# Patient Record
Sex: Male | Born: 1955 | Race: Black or African American | Hispanic: No | Marital: Married | Smoking: Never smoker
Health system: Southern US, Community
[De-identification: ages and names within clinical notes are randomized; demographics above are authoritative.]

## PROBLEM LIST (undated history)

## (undated) DIAGNOSIS — C300 Malignant neoplasm of nasal cavity: Secondary | ICD-10-CM

## (undated) DIAGNOSIS — J9819 Other pulmonary collapse: Secondary | ICD-10-CM

---

## 2004-08-11 ENCOUNTER — Ambulatory Visit: Payer: Self-pay | Admitting: Gastroenterology

## 2007-12-02 ENCOUNTER — Ambulatory Visit: Payer: Self-pay | Admitting: Gastroenterology

## 2009-12-13 ENCOUNTER — Ambulatory Visit: Payer: Self-pay | Admitting: Unknown Physician Specialty

## 2010-09-05 ENCOUNTER — Emergency Department: Payer: Self-pay | Admitting: Emergency Medicine

## 2012-10-08 DIAGNOSIS — J9819 Other pulmonary collapse: Secondary | ICD-10-CM

## 2012-10-08 HISTORY — DX: Other pulmonary collapse: J98.19

## 2014-10-14 ENCOUNTER — Inpatient Hospital Stay: Payer: Self-pay | Admitting: Surgery

## 2014-10-14 ENCOUNTER — Ambulatory Visit: Payer: Self-pay | Admitting: Family Medicine

## 2014-10-14 LAB — CBC WITH DIFFERENTIAL/PLATELET
BASOS ABS: 0 10*3/uL (ref 0.0–0.1)
Basophil %: 0.4 %
Eosinophil #: 0.3 10*3/uL (ref 0.0–0.7)
Eosinophil %: 3.2 %
HCT: 55.4 % — AB (ref 40.0–52.0)
HGB: 17.6 g/dL (ref 13.0–18.0)
LYMPHS PCT: 20.1 %
Lymphocyte #: 1.8 10*3/uL (ref 1.0–3.6)
MCH: 30.1 pg (ref 26.0–34.0)
MCHC: 31.8 g/dL — ABNORMAL LOW (ref 32.0–36.0)
MCV: 95 fL (ref 80–100)
MONOS PCT: 9.6 %
Monocyte #: 0.9 x10 3/mm (ref 0.2–1.0)
Neutrophil #: 5.9 10*3/uL (ref 1.4–6.5)
Neutrophil %: 66.7 %
PLATELETS: 178 10*3/uL (ref 150–440)
RBC: 5.85 10*6/uL (ref 4.40–5.90)
RDW: 14.7 % — AB (ref 11.5–14.5)
WBC: 8.9 10*3/uL (ref 3.8–10.6)

## 2014-10-14 LAB — BASIC METABOLIC PANEL
ANION GAP: 6 — AB (ref 7–16)
BUN: 14 mg/dL (ref 7–18)
CO2: 28 mmol/L (ref 21–32)
CREATININE: 1.54 mg/dL — AB (ref 0.60–1.30)
Calcium, Total: 8.9 mg/dL (ref 8.5–10.1)
Chloride: 105 mmol/L (ref 98–107)
GFR CALC NON AF AMER: 50 — AB
Glucose: 81 mg/dL (ref 65–99)
Osmolality: 277 (ref 275–301)
Potassium: 3.8 mmol/L (ref 3.5–5.1)
SODIUM: 139 mmol/L (ref 136–145)

## 2014-10-14 LAB — PROTIME-INR
INR: 1
Prothrombin Time: 13.2 secs (ref 11.5–14.7)

## 2014-10-14 LAB — APTT: ACTIVATED PTT: 30.6 s (ref 23.6–35.9)

## 2014-10-15 LAB — BASIC METABOLIC PANEL
ANION GAP: 6 — AB (ref 7–16)
BUN: 13 mg/dL (ref 7–18)
Calcium, Total: 8.5 mg/dL (ref 8.5–10.1)
Chloride: 107 mmol/L (ref 98–107)
Co2: 28 mmol/L (ref 21–32)
Creatinine: 1.38 mg/dL — ABNORMAL HIGH (ref 0.60–1.30)
EGFR (Non-African Amer.): 57 — ABNORMAL LOW
Glucose: 87 mg/dL (ref 65–99)
Osmolality: 281 (ref 275–301)
Potassium: 4 mmol/L (ref 3.5–5.1)
Sodium: 141 mmol/L (ref 136–145)

## 2014-10-15 LAB — CBC WITH DIFFERENTIAL/PLATELET
BASOS ABS: 0 10*3/uL (ref 0.0–0.1)
BASOS PCT: 0.3 %
Eosinophil #: 0.2 10*3/uL (ref 0.0–0.7)
Eosinophil %: 2.2 %
HCT: 52.2 % — AB (ref 40.0–52.0)
HGB: 16.9 g/dL (ref 13.0–18.0)
LYMPHS PCT: 12.5 %
Lymphocyte #: 1.2 10*3/uL (ref 1.0–3.6)
MCH: 30.4 pg (ref 26.0–34.0)
MCHC: 32.3 g/dL (ref 32.0–36.0)
MCV: 94 fL (ref 80–100)
MONOS PCT: 10.6 %
Monocyte #: 1 x10 3/mm (ref 0.2–1.0)
NEUTROS ABS: 7.3 10*3/uL — AB (ref 1.4–6.5)
Neutrophil %: 74.4 %
Platelet: 171 10*3/uL (ref 150–440)
RBC: 5.56 10*6/uL (ref 4.40–5.90)
RDW: 15.4 % — ABNORMAL HIGH (ref 11.5–14.5)
WBC: 9.8 10*3/uL (ref 3.8–10.6)

## 2014-10-19 LAB — CULTURE, BLOOD (SINGLE)

## 2014-10-21 ENCOUNTER — Ambulatory Visit: Payer: Self-pay | Admitting: Cardiothoracic Surgery

## 2014-10-21 ENCOUNTER — Ambulatory Visit: Payer: Self-pay | Admitting: Family Medicine

## 2014-11-08 ENCOUNTER — Ambulatory Visit: Payer: Self-pay | Admitting: Cardiothoracic Surgery

## 2014-11-15 ENCOUNTER — Encounter: Payer: Self-pay | Admitting: Urology

## 2014-12-07 ENCOUNTER — Encounter
Admit: 2014-12-07 | Disposition: A | Discharge status: Home / Self Care | Payer: Self-pay | Attending: Urology | Admitting: Urology

## 2015-02-06 NOTE — H&P (Signed)
   Subjective/Chief Complaint right sided chest pain.   History of Present Illness 59 y/o male preents to PCP with 2 days of right sided chest pain, chest xray earlier today shows large right PTX. no antecedent trauma, non-smoker.  Last ate around 12 today.   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 right forearm surgery   Primary Physician Dr Jacqualine Code.   Code Status Full Code   ALLERGIES:  No Known Allergies:   HOME MEDICATIONS: Medication Instructions Status  acetaminophen-HYDROcodone 300 mg-7.5 mg tablet 1 tab(s) orally every 6 hours as needed   Active  cyclobenzaprine 10 mg tablet 1 tab(s) orally 3 times a day as needed   Active  ibuprofen 800 mg tablet 1 tab(s) orally 3 times a day x 7 days  Active  Flonase 0.05 mg/inh nasal spray 2 spray(s) nasal 2 times a day to each nostril Active  vit D 3 2000 iu 2 tab(s)  once a day  Active   Family and Social History:  Family History Non-Contributory   Social History negative tobacco, negative ETOH, negative Illicit drugs   Place of Living Home   Review of Systems:  Subjective/Chief Complaint see above   Chest Pain Yes   Medications/Allergies Reviewed Medications/Allergies reviewed   Physical Exam:  GEN no acute distress, tall muscular male in NAD 98.6 p 76 bp 137/76   HEENT pale conjunctivae, PERRL   NECK supple   RESP normal resp effort  no use of accessory muscles  diminished breath sounds on right chest, no crepitus.   CARD regular rate   ABD denies tenderness  denies Flank Tenderness  no hernia  soft   LYMPH negative neck   EXTR negative cyanosis/clubbing, negative edema   SKIN normal to palpation   NEURO cranial nerves intact   PSYCH A+O to time, place, person, good insight    Assessment/Admission Diagnosis 59 y/o male non-smoker with spontaneous right sided pneumothorax.   Plan admit, right chest tube with anesthesia assistance.  will ask Dr Genevive Bi to evaluate Friday.   Discussed procedure with him and wife and all questions addressed.  Films personally reviewed.   Electronic Signatures: Sherri Rad (MD)  (Signed 07-Jan-16 18:30)  Authored: CHIEF COMPLAINT and HISTORY, ALLERGIES, HOME MEDICATIONS, FAMILY AND SOCIAL HISTORY, REVIEW OF SYSTEMS, PHYSICAL EXAM, ASSESSMENT AND PLAN   Last Updated: 07-Jan-16 18:30 by Sherri Rad (MD)

## 2015-02-06 NOTE — Discharge Summary (Signed)
PATIENT NAME:  Ronnie Daniels, ABDULLAH MR#:  [id number] DATE OF BIRTH:  January 29, 1956  DATE OF ADMISSION:  10/14/2014 DATE OF DISCHARGE:  10/17/2014  DIAGNOSIS: Spontaneous right pneumothorax.  PROCEDURE: Right chest tube placement.  HISTORY OF PRESENT ILLNESS AND HOSPITAL COURSE: This is a patient who presented to the Emergency Room and was admitted by Dr. Marina Gravel for spontaneous right pneumothorax. Dr. Marina Gravel had obtained consent for taking him to the operating room for chest tube placement. I performed that chest tube placement and he made a prompt recovery. Air leak sealed and his chest tube was removed by Dr. Marina Gravel, and he was discharged in stable condition on oral analgesics to follow up in our office in 10 days.   ____________________________ Jerrol Banana Burt Knack, MD rec:ST D: 10/22/2014 21:44:26 ET T: 10/23/2014 01:31:45 ET JOB#: [id number]  cc: Jerrol Banana. Burt Knack, MD, <Dictator> Florene Glen MD ELECTRONICALLY SIGNED 10/23/2014 19:31

## 2015-02-06 NOTE — Op Note (Signed)
PATIENT NAME:  Ronnie Daniels, Ronnie Daniels MR#:  [id number] DATE OF BIRTH:  1956/02/10  DATE OF PROCEDURE:  10/14/2014  PREOPERATIVE DIAGNOSIS: Spontaneous right pneumothorax.   POSTOPERATIVE DIAGNOSIS: Spontaneous right pneumothorax.   PROCEDURE: Right closed tube thoracostomy placement.   SURGEON:  Florene Glen, MD   ANESTHESIA: IV sedation and local anesthetic.   INDICATIONS: This is a patient with a spontaneous pneumothorax on the right. Dr. Marina Gravel admitted the patient and consented the patient. I reviewed with he and his family the rationale for placing a chest tube, the options of observation, the risk of bleeding, infection, the need for a thoracoscopy in the future if this were to happen again. He understood and agreed to proceed. This was all reviewed for him in the preop holding area personally, after reviewing his films and ensuring that he was marked properly.   He was taken to the operating room where he was placed into a mild right side up position with a pillow and well padded. He was given IV sedation, prepped and draped in a sterile fashion; 0.5% lidocaine plain was infiltrated in the skin and subcutaneous tissues and the mid axillary line on the right side, and then in this obese patient approximating the fifth rib, the needle was advanced over the top of the fifth rib and a pneumothorax was identified by aspiration.   An incision was made and dissection down to rib and over the fifth rib was performed. The clamp was placed in the pleural space opened and a gash of air exuded. Then a 24 French chest tube was placed into the same space, and held in with multiple 0 silk sutures, a U-suture of 0 silk was placed as well.   Then the chest tube was placed to Pleur-evac suction and a large air leak was noted.   Sterile dressing was placed. He was taken to the recovery room, where chest x-ray was ordered. He was left in stable condition in the PACU.     ____________________________ Jerrol Banana. Burt Knack, MD rec:nt D: 10/14/2014 20:06:48 ET T: 10/14/2014 22:22:09 ET JOB#: [id number]  cc: Jerrol Banana. Burt Knack, MD, <Dictator> Florene Glen MD ELECTRONICALLY SIGNED 10/15/2014 1:22

## 2016-05-08 ENCOUNTER — Ambulatory Visit
Admission: RE | Admit: 2016-05-08 | Discharge: 2016-05-08 | Disposition: A | Discharge status: Home / Self Care | Payer: BLUE CROSS/BLUE SHIELD | Source: Ambulatory Visit | Attending: Orthopedic Surgery | Admitting: Orthopedic Surgery

## 2016-05-15 ENCOUNTER — Ambulatory Visit
Admission: RE | Admit: 2016-05-15 | Discharge: 2016-05-15 | Disposition: A | Discharge status: Home / Self Care | Payer: BLUE CROSS/BLUE SHIELD | Source: Ambulatory Visit | Attending: Orthopedic Surgery | Admitting: Orthopedic Surgery

## 2016-05-15 ENCOUNTER — Ambulatory Visit
Admission: RE | Admit: 2016-05-15 | Discharge: 2016-05-15 | Disposition: A | Discharge status: Home / Self Care | Payer: BLUE CROSS/BLUE SHIELD | Source: Ambulatory Visit | Attending: Urology | Admitting: Urology

## 2016-05-15 DIAGNOSIS — Z0189 Encounter for other specified special examinations: Secondary | ICD-10-CM | POA: Diagnosis present

## 2016-05-15 LAB — CBC
HCT: 50.2 % (ref 40.0–52.0)
Hemoglobin: 16.8 g/dL (ref 13.0–18.0)
MCH: 31 pg (ref 26.0–34.0)
MCHC: 33.4 g/dL (ref 32.0–36.0)
MCV: 92.7 fL (ref 80.0–100.0)
PLATELETS: 174 10*3/uL (ref 150–440)
RBC: 5.41 MIL/uL (ref 4.40–5.90)
RDW: 14.2 % (ref 11.5–14.5)
WBC: 7 10*3/uL (ref 3.8–10.6)

## 2016-08-10 ENCOUNTER — Ambulatory Visit (INDEPENDENT_AMBULATORY_CARE_PROVIDER_SITE_OTHER): Payer: BLUE CROSS/BLUE SHIELD

## 2016-08-10 ENCOUNTER — Ambulatory Visit
Admission: EM | Admit: 2016-08-10 | Discharge: 2016-08-10 | Disposition: A | Discharge status: Home / Self Care | Payer: BLUE CROSS/BLUE SHIELD | Attending: Family Medicine | Admitting: Family Medicine

## 2016-08-10 DIAGNOSIS — J40 Bronchitis, not specified as acute or chronic: Secondary | ICD-10-CM

## 2016-08-10 HISTORY — DX: Malignant neoplasm of nasal cavity: C30.0

## 2016-08-10 HISTORY — DX: Other pulmonary collapse: J98.19

## 2016-08-10 MED ORDER — HYDROCOD POLST-CPM POLST ER 10-8 MG/5ML PO SUER: 5.0000 mL | Freq: Every evening | ORAL | 0 refills | Status: AC | PRN

## 2016-08-10 MED ORDER — AZITHROMYCIN 250 MG PO TABS: ORAL_TABLET | ORAL | 0 refills | Status: DC

## 2016-08-10 MED ORDER — BENZONATATE 100 MG PO CAPS: 100.0000 mg | ORAL_CAPSULE | Freq: Three times a day (TID) | ORAL | 0 refills | Status: DC | PRN

## 2016-08-10 NOTE — ED Provider Notes (Signed)
[facility] ____________________________________________  Time seen: Approximately 8:55 AM  I have reviewed the triage vital signs and the nursing notes.   HISTORY  Chief Complaint Cough  HPI Ronnie Daniels is a 60 y.o. male presents for the complaints of 1 week of cough and chest congestion. Patient reports he had some nasal congestion which has improved. Patient states cough increases at night but continues throughout the day. States occasionally productive of whitish whitish mucus. Denies and fevers. Reports continues to eat and drink well. Patient reports his grandson recently had similar symptoms.  Denies chest pain, shortness of breath, chest pain with deep breath, abdominal pain, dysuria, neck pain, back pain, recent sickness or recent antibiotic use. 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  PCP: [facility]   Past Medical History:  Diagnosis Date  . 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   History reviewed. No pertinent surgical history.  Current Outpatient Rx  . Order #: [id number] Class: Historical Med  . Order #: [id number] Class: Historical Med  . Order #: [id number] Class: Normal  . Order #: [id number] Class: Normal  . Order #: [id number] Class: Print    No current facility-administered medications for this encounter.   Current Outpatient Prescriptions:  .  tadalafil (CIALIS) 10 MG tablet, Take 10 mg by mouth daily as needed for erectile dysfunction., Disp: , Rfl:  .  Testosterone 20 % CREA, by Does not apply route., Disp: , Rfl:  .  azithromycin (ZITHROMAX Z-PAK) 250 MG tablet, Take 2 tablets (500 mg) on  Day 1,  followed by 1 tablet (250 mg) once daily on Days 2 through 5., Disp: 6 each, Rfl: 0 .  benzonatate (TESSALON PERLES) 100 MG capsule, Take 1 capsule (100 mg total) by mouth 3 (three) times  daily as needed for cough., Disp: 15 capsule, Rfl: 0 .  chlorpheniramine-HYDROcodone (TUSSIONEX PENNKINETIC ER) 10-8 MG/5ML SUER, Take 5 mLs by mouth at bedtime as needed for cough. do not drive or operate machinery while taking as can cause drowsiness., Disp: 100 mL, Rfl: 0  Allergies Review of patient's allergies indicates no known allergies.  Family History  Problem Relation Age of Onset  . Kidney disease Mother   . Hypertension Mother   . Alzheimer's disease Father     Social History Social History  Substance Use Topics  . Smoking status: Never Smoker  . Smokeless tobacco: Never Used  . Alcohol use No    Review of Systems Constitutional: No fever/chills Eyes: No visual changes. ENT: No sore throat. Cardiovascular: Denies chest pain. Respiratory: Denies shortness of breath. As above.  Gastrointestinal: No abdominal pain.  No nausea, no vomiting.  No diarrhea.  No constipation. Genitourinary: Negative for dysuria. Musculoskeletal: Negative for back pain. Skin: Negative for rash. Neurological: Negative for headaches, focal weakness or numbness.  10-point ROS otherwise negative.  ____________________________________________   PHYSICAL EXAM:  VITAL SIGNS: ED Triage Vitals  Enc Vitals Group     BP 08/10/16 0850 127/70     Pulse Rate 08/10/16 0850 71     Resp 08/10/16 0850 16     Temp 08/10/16 0850 97.6 F (36.4 C)     Temp Source 08/10/16 0850 Tympanic     SpO2 08/10/16 0850 98 %     Weight 08/10/16 0846 258 lb (117 kg)     Height  08/10/16 0846 6\' 5"  (1.956 m)     Head Circumference --      Peak Flow --      Pain Score 08/10/16 0850 0     Pain Loc --      Pain Edu? --      Excl. in [place]? --     Constitutional: Alert and oriented. Well appearing and in no acute distress. Eyes: Conjunctivae are normal. PERRL. EOMI. ENT      Head: Normocephalic and atraumatic.      Ears: no erythema, normal TMs bilaterally.       Nose: No congestion/rhinnorhea.       Mouth/Throat: Mucous membranes are moist.Oropharynx non-erythematous.No pharyngeal erythema.  Neck: No stridor. Supple without meningismus.  Hematological/Lymphatic/Immunilogical: No cervical lymphadenopathy. Cardiovascular: Normal rate, regular rhythm. Grossly normal heart sounds.  Good peripheral circulation. Respiratory: Normal respiratory effort without tachypnea nor retractions. No wheezes. Good air movement. Mild scattered rhonchi.  Gastrointestinal: Soft and nontender. No distention. No CVA tenderness. Musculoskeletal:  Nontender with normal range of motion in all extremities. No midline cervical, thoracic or lumbar tenderness to palpation.  Neurologic:  Normal speech and language. No gross focal neurologic deficits are appreciated. Speech is normal. No gait instability.  Skin:  Skin is warm, dry and intact. No rash noted. Psychiatric: Mood and affect are normal. Speech and behavior are normal. Patient exhibits appropriate insight and judgment   ___________________________________________   LABS (all labs ordered are listed, but only abnormal results are displayed)  Labs Reviewed - No data to display ____________________________________________   RADIOLOGY  Dg Chest 2 View  Result Date: 08/10/2016 CLINICAL DATA:  Cough. EXAM: CHEST  2 VIEW COMPARISON:  10/21/2014 . FINDINGS: Mediastinum hilar structures normal. Cardiomegaly with normal pulmonary vascularity. Interval resolution of basilar atelectasis. Small bilateral pleural effusions versus pleural scarring again noted . Mild bilateral pleural thickening noted IMPRESSION: 1. No acute pulmonary disease. Interval resolution of previously identified bibasilar atelectasis noted on chest x-ray of 10/21/2014. 2. Mild basilar pleural thickening noted consistent with scarring. Tiny pleural effusions cannot be excluded . Electronically Signed   By: Marcello Moores  Register   On: 08/10/2016 09:29    ____________________________________________   PROCEDURES Procedures   INITIAL IMPRESSION / ASSESSMENT AND PLAN / ED COURSE  Pertinent labs & imaging results that were available during my care of the patient were reviewed by me and considered in my medical decision making (see chart for details).  Well-appearing patient. No acute distress. Presenting for the complaints of cough for the last week. Mild scattered rhonchi. Patient does have a history of a spontaneous lung collapse, will evaluate chest x-ray.    Chest x-ray per radiologist, no acute pulmonary disease, interval resolution of previously identified atelectasis, mild basilar pleural thickening noted. Will treat patient with azithromycin, when necessary Tessalon Perles and when necessary Tussionex at night. Encouraged supportive care, rest, fluids and PCP follow-up. Discussed indication, risks and benefits of medications with patient.  Discussed follow up with Primary care physician this week. Discussed follow up and return parameters including no resolution or any worsening concerns. Patient verbalized understanding and agreed to plan.   ____________________________________________   FINAL CLINICAL IMPRESSION(S) / ED DIAGNOSES  Final diagnoses:  Bronchitis     New Prescriptions   AZITHROMYCIN (ZITHROMAX Z-PAK) 250 MG TABLET    Take 2 tablets (500 mg) on  Day 1,  followed by 1 tablet (250 mg) once daily on Days 2 through 5.   BENZONATATE (TESSALON PERLES) 100 MG CAPSULE  Take 1 capsule (100 mg total) by mouth 3 (three) times daily as needed for cough.   CHLORPHENIRAMINE-HYDROCODONE (TUSSIONEX PENNKINETIC ER) 10-8 MG/5ML SUER    Take 5 mLs by mouth at bedtime as needed for cough. do not drive or operate machinery while taking as can cause drowsiness.    Note: This dictation was prepared with Dragon dictation along with smaller phrase technology. Any transcriptional errors that result from this process are unintentional.     Clinical Course      Marylene Land, NP 08/10/16 [place], NP 08/10/16 [phone]

## 2016-08-10 NOTE — ED Triage Notes (Signed)
Patient complains of cough with mucus productivity. Patient states that he has been having a scratchy throat and hoarseness x 1 week.

## 2016-08-10 NOTE — Discharge Instructions (Signed)
Take medication as prescribed. Rest. Drink plenty of fluids.  ° °Follow up with your primary care physician this week as needed. Return to Urgent care for new or worsening concerns.  ° °

## 2016-10-15 ENCOUNTER — Ambulatory Visit
Admission: EM | Admit: 2016-10-15 | Discharge: 2016-10-15 | Disposition: A | Discharge status: Home / Self Care | Payer: BLUE CROSS/BLUE SHIELD | Attending: Family Medicine | Admitting: Family Medicine

## 2016-10-15 DIAGNOSIS — J069 Acute upper respiratory infection, unspecified: Secondary | ICD-10-CM

## 2016-10-15 DIAGNOSIS — J209 Acute bronchitis, unspecified: Secondary | ICD-10-CM | POA: Diagnosis not present

## 2016-10-15 MED ORDER — HYDROCOD POLST-CPM POLST ER 10-8 MG/5ML PO SUER: 5.0000 mL | Freq: Two times a day (BID) | ORAL | 0 refills | Status: AC | PRN

## 2016-10-15 MED ORDER — ALBUTEROL SULFATE HFA 108 (90 BASE) MCG/ACT IN AERS: 2.0000 | INHALATION_SPRAY | RESPIRATORY_TRACT | 0 refills | Status: AC | PRN

## 2016-10-15 MED ORDER — AZITHROMYCIN 250 MG PO TABS: ORAL_TABLET | ORAL | 0 refills | Status: AC

## 2016-10-15 NOTE — ED Triage Notes (Signed)
Pt c/o cough that has lasted over a week and he is coughing up yellow phlegm.

## 2016-10-15 NOTE — ED Provider Notes (Signed)
[facility]    CSN: [id number] Arrival date & time: 10/15/16  0816     History   Chief Complaint Chief Complaint  Patient presents with  . Cough    HPI Ronnie Daniels is a 61 y.o. male.   Patient's here because of cough and congestion. States it started last week Monday but over the last 3 days is gotten worse is now become productive and some solids chest. He also reports some wheezing but not on a regular basis. He is not allergic to any medications he does not smoke. He states he has something like this about 2 months ago. Z-Pak and Tussionex and clear things up. He denies any chronic medical problems and no significant family medical history pertinent to today's visit.   The history is provided by the patient. No language interpreter was used.  Cough  Cough characteristics:  Non-productive Sputum characteristics:  Yellow Severity:  Mild Onset quality:  Gradual Timing:  Constant Progression:  Worsening Chronicity:  New Context: sick contacts and upper respiratory infection   Relieved by:  Cough suppressants Worsened by:  Nothing Ineffective treatments:  Decongestant Associated symptoms: wheezing   Associated symptoms: no rash and no rhinorrhea   Risk factors: recent infection     Past Medical History:  Diagnosis Date  . 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   History reviewed. No pertinent surgical history.     Home Medications    Prior to Admission medications   Medication Sig Start Date End Date Taking? Authorizing Provider  tadalafil (CIALIS) 10 MG tablet Take 10 mg by mouth daily as needed for erectile dysfunction.   Yes Historical Provider, MD  Testosterone 20 % CREA by Does not apply route.   Yes Historical Provider, MD  albuterol (PROVENTIL HFA;VENTOLIN HFA) 108 (90 Base) MCG/ACT inhaler Inhale 2 puffs into the lungs every 4 (four) hours as needed  for wheezing or shortness of breath. 10/15/16   Frederich Cha, MD  azithromycin (ZITHROMAX Z-PAK) 250 MG tablet Take 2 tablets first day and then 1 po a day for 4 days 10/15/16   Frederich Cha, MD  chlorpheniramine-HYDROcodone [facility] ER) 10-8 MG/5ML SUER Take 5 mLs by mouth at bedtime as needed for cough. do not drive or operate machinery while taking as can cause drowsiness. 08/10/16   Marylene Land, NP  chlorpheniramine-HYDROcodone [facility] PENNKINETIC ER) 10-8 MG/5ML SUER Take 5 mLs by mouth every 12 (twelve) hours as needed for cough. 10/15/16   Frederich Cha, MD    Family History Family History  Problem Relation Age of Onset  . Kidney disease Mother   . Hypertension Mother   . Alzheimer's disease Father     Social History Social History  Substance Use Topics  . Smoking status: Never Smoker  . Smokeless tobacco: Never Used  . Alcohol use No     Allergies   Patient has no known allergies.   Review of Systems Review of Systems  HENT: Positive for congestion. Negative for rhinorrhea.   Respiratory: Positive for cough and wheezing.   Skin: Negative for rash.  All other systems reviewed and are negative.    Physical Exam Triage Vital Signs ED Triage Vitals  Enc Vitals Group     BP 10/15/16 0827 131/80     Pulse Rate 10/15/16 0827 81     Resp 10/15/16 0827 18  Temp 10/15/16 0827 99.1 F (37.3 C)     Temp Source 10/15/16 0827 Oral     SpO2 10/15/16 0827 96 %     Weight 10/15/16 0827 258 lb (117 kg)     Height 10/15/16 0827 6\' 4"  (1.93 m)     Head Circumference --      Peak Flow --      Pain Score 10/15/16 0828 0     Pain Loc --      Pain Edu? --      Excl. in [place]? --    No data found.   Updated Vital Signs BP 131/80 (BP Location: Left Arm)   Pulse 81   Temp 99.1 F (37.3 C) (Oral)   Resp 18   Ht 6\' 4"  (1.93 m)   Wt 258 lb (117 kg)   SpO2 96%   BMI 31.40 kg/m   Visual Acuity Right Eye Distance:   Left Eye Distance:   Bilateral Distance:      Right Eye Near:   Left Eye Near:    Bilateral Near:     Physical Exam  Constitutional: He is oriented to person, place, and time. He appears well-developed and well-nourished. No distress.  HENT:  Head: Normocephalic and atraumatic.  Right Ear: External ear normal.  Left Ear: External ear normal.  Eyes: Pupils are equal, round, and reactive to light.  Neck: Normal range of motion. Neck supple.  Cardiovascular: Normal rate, regular rhythm and normal heart sounds.   Pulmonary/Chest: Breath sounds normal. He is in respiratory distress.  Musculoskeletal: Normal range of motion. He exhibits no tenderness or deformity.  Neurological: He is alert and oriented to person, place, and time.  Skin: Skin is warm. He is not diaphoretic.  Psychiatric: He has a normal mood and affect.  Vitals reviewed.    UC Treatments / Results  Labs (all labs ordered are listed, but only abnormal results are displayed) Labs Reviewed - No data to display  EKG  EKG Interpretation None       Radiology No results found.  Procedures Procedures (including critical care time)  Medications Ordered in UC Medications - No data to display   Initial Impression / Assessment and Plan / UC Course  I have reviewed the triage vital signs and the nursing notes.  Pertinent labs & imaging results that were available during my care of the patient were reviewed by me and considered in my medical decision making (see chart for details).  Clinical Course    We'll place patient on Tussionex 1 teaspoon twice a day Zithromax Z-PAK. Also will going to place him on albuterol inhaler since he has has some wheezing those lungs clear today. Follow-up PCP in 1-2 weeks as needed.  Final Clinical Impressions(s) / UC Diagnoses   Final diagnoses:  Upper respiratory tract infection, unspecified type  Acute bronchitis, unspecified organism    New Prescriptions New Prescriptions   ALBUTEROL (PROVENTIL HFA;VENTOLIN HFA)  108 (90 BASE) MCG/ACT INHALER    Inhale 2 puffs into the lungs every 4 (four) hours as needed for wheezing or shortness of breath.   AZITHROMYCIN (ZITHROMAX Z-PAK) 250 MG TABLET    Take 2 tablets first day and then 1 po a day for 4 days   CHLORPHENIRAMINE-HYDROCODONE (TUSSIONEX PENNKINETIC ER) 10-8 MG/5ML SUER    Take 5 mLs by mouth every 12 (twelve) hours as needed for cough.     Frederich Cha, MD 10/15/16 [phone]

## 2018-08-17 IMAGING — CR DG CHEST 2V
2 series · 2 of 2 positions shown · non-contrast
Comparison: 10/21/2014 .

CLINICAL DATA: Cough.

EXAM:
CHEST  2 VIEW

[chest pa]
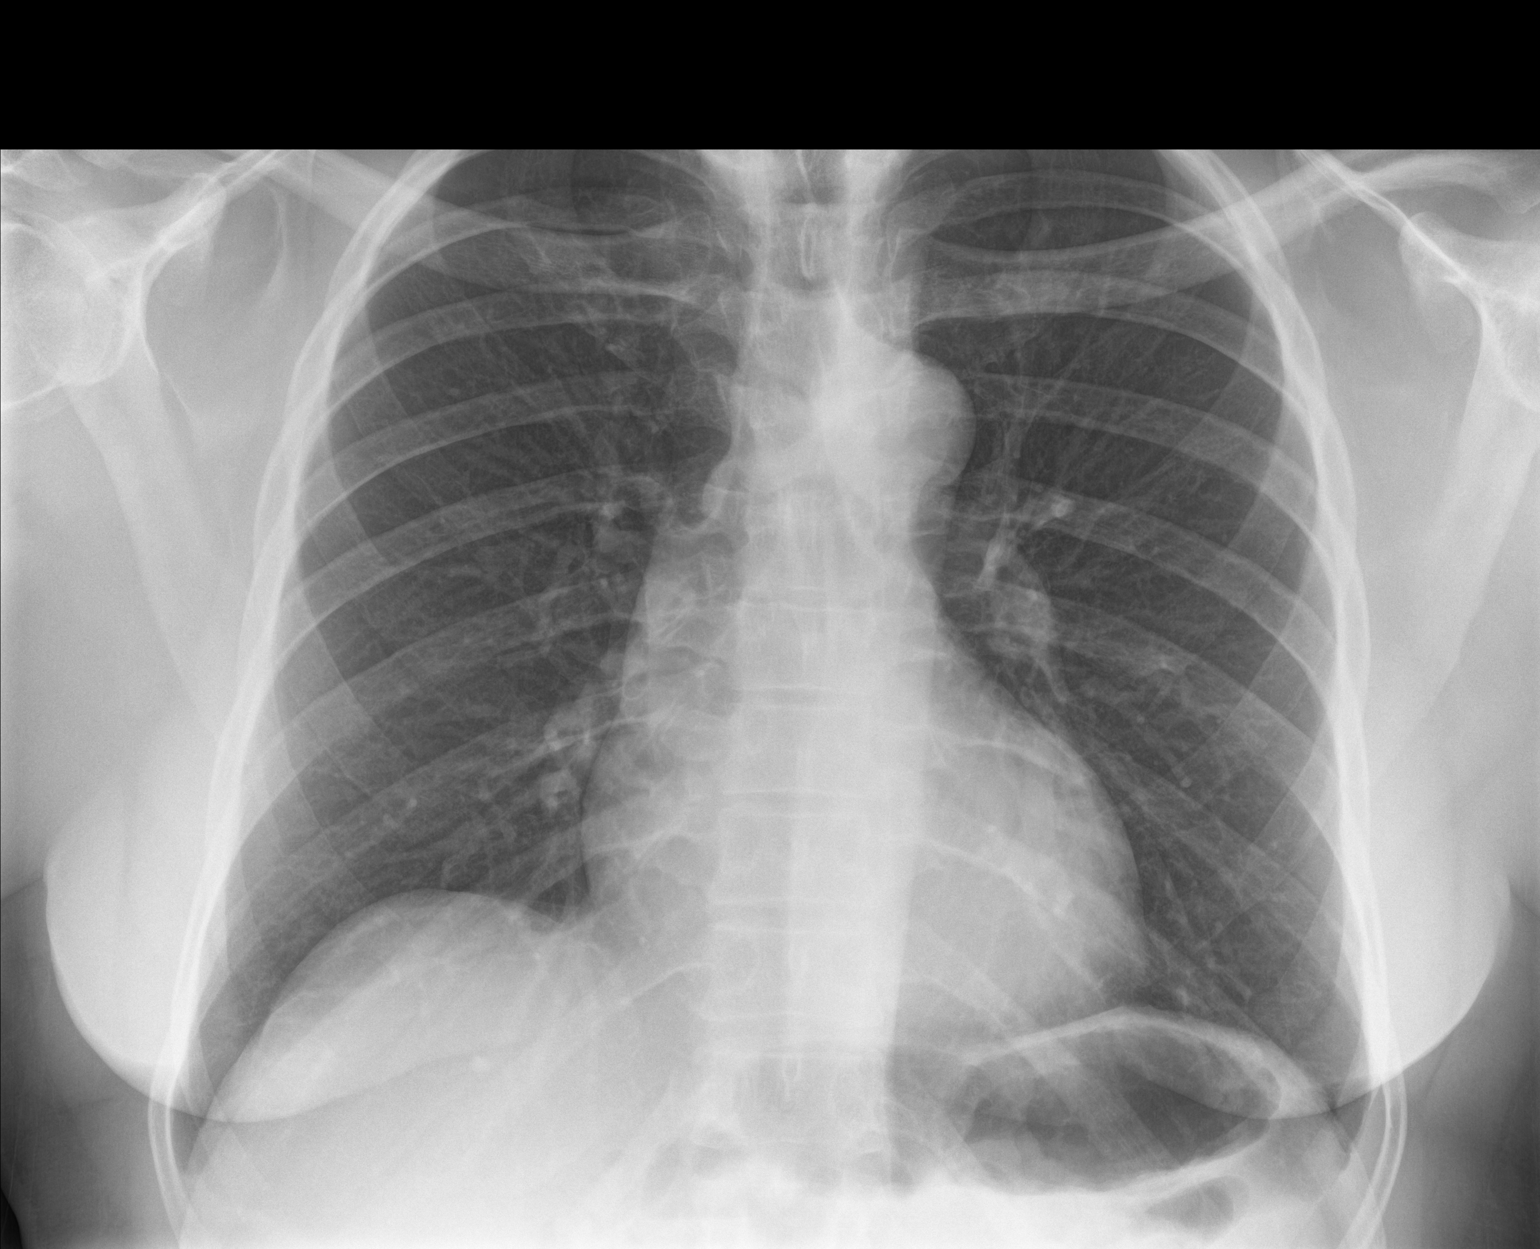

[chest lat]
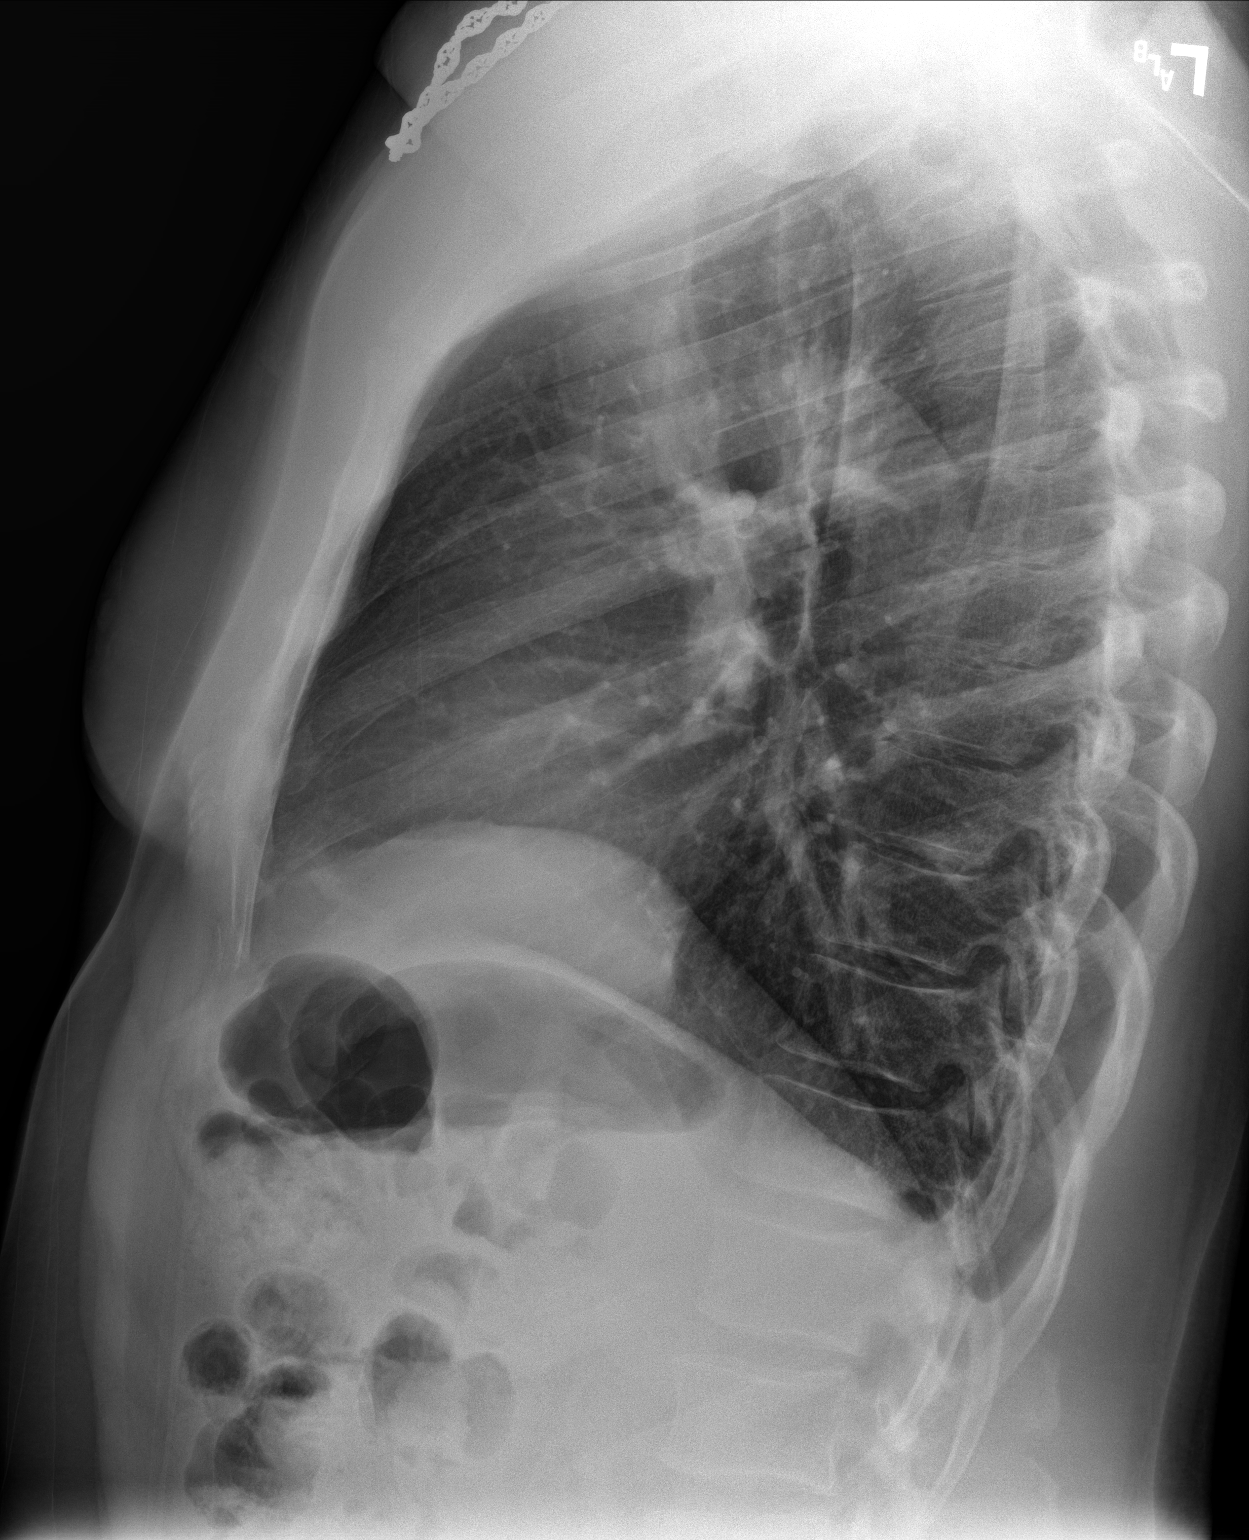

[2 of 2 positions shown; findings below may reference images not displayed]

FINDINGS: Mediastinum hilar structures normal. Cardiomegaly with normal
pulmonary vascularity. Interval resolution of basilar atelectasis.
Small bilateral pleural effusions versus pleural scarring again
noted . Mild bilateral pleural thickening noted
IMPRESSION: 1. No acute pulmonary disease. Interval resolution of previously
identified bibasilar atelectasis noted on chest x-ray of 10/21/2014.

2. Mild basilar pleural thickening noted consistent with scarring.
Tiny pleural effusions cannot be excluded .

## 2019-12-26 ENCOUNTER — Ambulatory Visit: Payer: BLUE CROSS/BLUE SHIELD | Attending: Internal Medicine

## 2019-12-26 DIAGNOSIS — Z23 Encounter for immunization: Secondary | ICD-10-CM

## 2019-12-26 NOTE — Progress Notes (Signed)
   Covid-19 Vaccination Clinic  Name:  Ronnie Daniels    MRN: [id number] DOB: 1956/01/29  12/26/2019  Mr. Ronnie Daniels was observed post Covid-19 immunization for 15 minutes without incident. He was provided with Vaccine Information Sheet and instruction to access the V-Safe system.   Mr. Ronnie Daniels was instructed to call 911 with any severe reactions post vaccine: Marland Kitchen Difficulty breathing  . Swelling of face and throat  . A fast heartbeat  . A bad rash all over body  . Dizziness and weakness   Immunizations Administered    Name Date Dose VIS Date Route   Pfizer COVID-19 Vaccine 12/26/2019 12:26 PM 0.3 mL 09/18/2019 Intramuscular   Manufacturer: [place]   Lot: [id number]   [place]: [id number]

## 2020-01-16 ENCOUNTER — Ambulatory Visit: Payer: BLUE CROSS/BLUE SHIELD | Attending: Internal Medicine

## 2020-01-16 DIAGNOSIS — Z23 Encounter for immunization: Secondary | ICD-10-CM

## 2020-01-16 NOTE — Progress Notes (Signed)
   Covid-19 Vaccination Clinic  Name:  BROGAN DEZEEUW    MRN: [id number] DOB: 05-08-1956  01/16/2020  Mr. Tomita was observed post Covid-19 immunization for 15 minutes   without incident. He was provided with Vaccine Information Sheet and instruction to access the V-Safe system.   Mr. Sridhar was instructed to call 911 with any severe reactions post vaccine: Marland Kitchen Difficulty breathing  . Swelling of face and throat  . A fast heartbeat  . A bad rash all over body  . Dizziness and weakness   Immunizations Administered    Name Date Dose VIS Date Route   Pfizer COVID-19 Vaccine 01/16/2020 12:40 PM 0.3 mL 09/18/2019 Intramuscular   Manufacturer: [place]   Lot: [id number]   [place]: [id number]
